# Patient Record
Sex: Female | Born: 1935 | Race: Black or African American | Hispanic: No | Smoking: Never smoker
Health system: Southern US, Community
[De-identification: ages and names within clinical notes are randomized; demographics above are authoritative.]

## PROBLEM LIST (undated history)

## (undated) DIAGNOSIS — K219 Gastro-esophageal reflux disease without esophagitis: Secondary | ICD-10-CM

## (undated) DIAGNOSIS — N289 Disorder of kidney and ureter, unspecified: Secondary | ICD-10-CM

## (undated) DIAGNOSIS — Z992 Dependence on renal dialysis: Secondary | ICD-10-CM

## (undated) DIAGNOSIS — I10 Essential (primary) hypertension: Secondary | ICD-10-CM

---

## 2013-07-04 ENCOUNTER — Emergency Department (HOSPITAL_COMMUNITY): Payer: Medicare Other

## 2013-07-04 ENCOUNTER — Encounter (HOSPITAL_COMMUNITY): Payer: Self-pay | Admitting: Emergency Medicine

## 2013-07-04 ENCOUNTER — Emergency Department (HOSPITAL_COMMUNITY)
Admission: EM | Admit: 2013-07-04 | Discharge: 2013-07-04 | Disposition: A | Discharge status: Home / Self Care | Payer: Medicare Other | Attending: Emergency Medicine | Admitting: Emergency Medicine

## 2013-07-04 DIAGNOSIS — K219 Gastro-esophageal reflux disease without esophagitis: Secondary | ICD-10-CM | POA: Insufficient documentation

## 2013-07-04 DIAGNOSIS — R7989 Other specified abnormal findings of blood chemistry: Secondary | ICD-10-CM

## 2013-07-04 DIAGNOSIS — Z79899 Other long term (current) drug therapy: Secondary | ICD-10-CM | POA: Insufficient documentation

## 2013-07-04 DIAGNOSIS — N19 Unspecified kidney failure: Secondary | ICD-10-CM

## 2013-07-04 DIAGNOSIS — Z992 Dependence on renal dialysis: Secondary | ICD-10-CM | POA: Insufficient documentation

## 2013-07-04 DIAGNOSIS — Z7982 Long term (current) use of aspirin: Secondary | ICD-10-CM | POA: Insufficient documentation

## 2013-07-04 DIAGNOSIS — R799 Abnormal finding of blood chemistry, unspecified: Secondary | ICD-10-CM | POA: Insufficient documentation

## 2013-07-04 DIAGNOSIS — I120 Hypertensive chronic kidney disease with stage 5 chronic kidney disease or end stage renal disease: Secondary | ICD-10-CM | POA: Insufficient documentation

## 2013-07-04 DIAGNOSIS — N186 End stage renal disease: Secondary | ICD-10-CM | POA: Insufficient documentation

## 2013-07-04 HISTORY — DX: Dependence on renal dialysis: Z99.2

## 2013-07-04 HISTORY — DX: Disorder of kidney and ureter, unspecified: N28.9

## 2013-07-04 HISTORY — DX: Gastro-esophageal reflux disease without esophagitis: K21.9

## 2013-07-04 HISTORY — DX: Essential (primary) hypertension: I10

## 2013-07-04 LAB — CBC WITH DIFFERENTIAL/PLATELET
BASOS PCT: 1 % (ref 0–1)
Basophils Absolute: 0 10*3/uL (ref 0.0–0.1)
EOS PCT: 10 % — AB (ref 0–5)
Eosinophils Absolute: 0.4 10*3/uL (ref 0.0–0.7)
HEMATOCRIT: 38.7 % (ref 36.0–46.0)
HEMOGLOBIN: 12.2 g/dL (ref 12.0–15.0)
Lymphocytes Relative: 16 % (ref 12–46)
Lymphs Abs: 0.6 10*3/uL — ABNORMAL LOW (ref 0.7–4.0)
MCH: 29.1 pg (ref 26.0–34.0)
MCHC: 31.5 g/dL (ref 30.0–36.0)
MCV: 92.4 fL (ref 78.0–100.0)
MONO ABS: 0.4 10*3/uL (ref 0.1–1.0)
MONOS PCT: 10 % (ref 3–12)
NEUTROS ABS: 2.4 10*3/uL (ref 1.7–7.7)
Neutrophils Relative %: 64 % (ref 43–77)
Platelets: 63 10*3/uL — ABNORMAL LOW (ref 150–400)
RBC: 4.19 MIL/uL (ref 3.87–5.11)
RDW: 17.9 % — ABNORMAL HIGH (ref 11.5–15.5)
WBC: 3.8 10*3/uL — ABNORMAL LOW (ref 4.0–10.5)

## 2013-07-04 LAB — COMPREHENSIVE METABOLIC PANEL
ALBUMIN: 3.4 g/dL — AB (ref 3.5–5.2)
ALT: 12 U/L (ref 0–35)
AST: 23 U/L (ref 0–37)
Alkaline Phosphatase: 87 U/L (ref 39–117)
BUN: 26 mg/dL — AB (ref 6–23)
CALCIUM: 9.6 mg/dL (ref 8.4–10.5)
CHLORIDE: 90 meq/L — AB (ref 96–112)
CO2: 29 meq/L (ref 19–32)
CREATININE: 4.38 mg/dL — AB (ref 0.50–1.10)
GFR calc Af Amer: 10 mL/min — ABNORMAL LOW (ref >90)
GFR, EST NON AFRICAN AMERICAN: 9 mL/min — AB (ref >90)
Glucose, Bld: 116 mg/dL — ABNORMAL HIGH (ref 70–99)
Potassium: 3.5 mEq/L — ABNORMAL LOW (ref 3.7–5.3)
Sodium: 136 mEq/L — ABNORMAL LOW (ref 137–147)
Total Bilirubin: 0.7 mg/dL (ref 0.3–1.2)
Total Protein: 8.3 g/dL (ref 6.0–8.3)

## 2013-07-04 LAB — D-DIMER, QUANTITATIVE (NOT AT ARMC): D-Dimer, Quant: 1.17 ug/mL-FEU — ABNORMAL HIGH (ref 0.00–0.48)

## 2013-07-04 LAB — PRO B NATRIURETIC PEPTIDE

## 2013-07-04 LAB — TROPONIN I

## 2013-07-04 MED ORDER — IOHEXOL 350 MG/ML SOLN
100.0000 mL | Freq: Once | INTRAVENOUS | Status: AC | PRN
  Administered 2013-07-04: 100 mL via INTRAVENOUS

## 2013-07-04 NOTE — ED Provider Notes (Addendum)
CSN: [id number]     Arrival date & time 07/04/13  1450 History   First MD Initiated Contact with Patient 07/04/13 1507     Chief Complaint  Patient presents with  . Shortness of Breath     (Consider location/radiation/quality/duration/timing/severity/associated sxs/prior Treatment) Patient is a 79 y.o. female presenting with shortness of breath. The history is provided by the patient (the pt complains of sob for one month).  Shortness of Breath Severity:  Mild Onset quality:  Gradual Timing:  Constant Progression:  Waxing and waning Chronicity:  New Context: not activity   Associated symptoms: no abdominal pain, no chest pain, no cough, no headaches and no rash     Past Medical History  Diagnosis Date  . Renal disorder   . Dialysis patient   . Hypertension   . GERD (gastroesophageal reflux disease)    No past surgical history on file. No family history on file. History  Substance Use Topics  . Smoking status: Never Smoker   . Smokeless tobacco: Not on file  . Alcohol Use: No   OB History   Grav Para Term Preterm Abortions TAB SAB Ect Mult Living                 Review of Systems  Constitutional: Negative for appetite change and fatigue.  HENT: Negative for congestion, ear discharge and sinus pressure.   Eyes: Negative for discharge.  Respiratory: Positive for shortness of breath. Negative for cough.   Cardiovascular: Negative for chest pain.  Gastrointestinal: Negative for abdominal pain and diarrhea.  Genitourinary: Negative for frequency and hematuria.  Musculoskeletal: Negative for back pain.  Skin: Negative for rash.  Neurological: Negative for seizures and headaches.  Psychiatric/Behavioral: Negative for hallucinations.      Allergies  Review of patient's allergies indicates no known allergies.  Home Medications   Prior to Admission medications   Medication Sig Start Date End Date Taking? Authorizing Provider  aspirin EC 81 MG tablet Take 81 mg by  mouth at bedtime.   Yes Historical Provider, MD  b complex-vitamin c-folic acid (NEPHRO-VITE) 0.8 MG TABS tablet Take 1 tablet by mouth daily.   Yes Historical Provider, MD  brimonidine (ALPHAGAN P) 0.1 % SOLN Place 1 drop into both eyes every 8 (eight) hours.   Yes Historical Provider, MD  cloNIDine (CATAPRES) 0.1 MG tablet Take 0.1 mg by mouth 2 (two) times daily.   Yes Historical Provider, MD  docusate sodium (COLACE) 100 MG capsule Take 100 mg by mouth daily as needed. constipation   Yes Historical Provider, MD  dorzolamide-timolol (COSOPT) 22.3-6.8 MG/ML ophthalmic solution Place 1 drop into both eyes 2 (two) times daily.   Yes Historical Provider, MD  lovastatin (MEVACOR) 20 MG tablet Take 20 mg by mouth at bedtime.   Yes Historical Provider, MD  ranitidine (ZANTAC) 150 MG tablet Take 150 mg by mouth 2 (two) times daily.   Yes Historical Provider, MD  Travoprost, BAK Free, (TRAVATAN) 0.004 % SOLN ophthalmic solution Place 1 drop into both eyes at bedtime.   Yes Historical Provider, MD   BP 137/77  Pulse 93  Temp(Src) 97.7 F (36.5 C) (Oral)  Resp 18  SpO2 97% Physical Exam  Constitutional: She is oriented to person, place, and time. She appears well-developed.  HENT:  Head: Normocephalic.  Eyes: Conjunctivae and EOM are normal. No scleral icterus.  Neck: Neck supple. No thyromegaly present.  Cardiovascular: Normal rate and regular rhythm.  Exam reveals no gallop and no friction rub.  No murmur heard. Pulmonary/Chest: No stridor. She has no wheezes. She has no rales. She exhibits no tenderness.  Abdominal: She exhibits no distension. There is no tenderness. There is no rebound.  Musculoskeletal: Normal range of motion. She exhibits no edema.  dialysis graft both arms.  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  Neurological: She is oriented to person, place, and time. She exhibits normal muscle tone. Coordination normal.  Skin: No  rash noted. No erythema.  Psychiatric: She has a normal mood and affect. Her behavior is normal.    ED Course  Procedures (including critical care time) Labs Review Labs Reviewed  CBC WITH DIFFERENTIAL - Abnormal; Notable for the following:    WBC 3.8 (*)    RDW 17.9 (*)    Platelets 63 (*)    Lymphs Abs 0.6 (*)    Eosinophils Relative 10 (*)    All other components within normal limits  COMPREHENSIVE METABOLIC PANEL - Abnormal; Notable for the following:    Sodium 136 (*)    Potassium 3.5 (*)    Chloride 90 (*)    Glucose, Bld 116 (*)    BUN 26 (*)    Creatinine, Ser 4.38 (*)    Albumin 3.4 (*)    GFR calc non Af Amer 9 (*)    GFR calc Af Amer 10 (*)    All other components within normal limits  D-DIMER, QUANTITATIVE - Abnormal; Notable for the following:    D-Dimer, Quant 1.17 (*)    All other components within normal limits  PRO B NATRIURETIC PEPTIDE - Abnormal; Notable for the following:    Pro B Natriuretic peptide (BNP) >70000.0 (*)    All other components within normal limits  TROPONIN I    Imaging Review Ct Angio Chest Pe W/cm &/or Wo Cm  07/04/2013   CLINICAL DATA:  Chest pain. Shortness of breath. Current 30 year history of hemodialysis.  EXAM: CT ANGIOGRAPHY CHEST WITH CONTRAST  TECHNIQUE: Multidetector CT imaging of the chest was performed using the standard protocol during bolus administration of intravenous contrast. Multiplanar CT image reconstructions and MIPs were obtained to evaluate the vascular anatomy.  CONTRAST:  100mL OMNIPAQUE IOHEXOL 350 MG/ML IV.  COMPARISON:  None.  FINDINGS: Contrast opacification of the pulmonary arteries is good. Respiratory motion blurred many of the images. Overall, the study is of good diagnostic quality.  No filling defects within either main pulmonary artery or their branches in either lung to suggest pulmonary embolism. 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 Extensive 3 vessel coronary  atherosclerosis. Mitral annular and aortic annular calcification. Mild aortic valvular calcification. No pericardial effusion. Moderate atherosclerosis involving the thoracic and upper abdominal aorta without evidence of aneurysm.  Numerous collateral veins in the right chest wall upon injection of a peripheral vein in the right upper extremity, consistent with central venous occlusion (as there is no opacification of the right axillary, are right subclavian, or innominate vein). Edema in the subcutaneous tissues of the right chest wall. Dense opacification of the inferior vena cava is likely due to reconstitution of the IVC via the transthoracic collaterals. However, opacification of the hepatic veins is likely due to right heart disease.  Dialysis catheter tip in the lower right atrium. Numerous enlarged mediastinal and bilateral axillary lymph nodes. Index right paratracheal node (station 4R) measures approximately 2.4 x 1.6 cm (series 4, image 30). Index right axillary node measures approximately  2.0 x 2.3 cm (series 4, image 18). Index left axillary node measures approximately 1.3 x 2.2 cm (series 4, image 21).  Small bilateral pleural effusions and associated mild passive atelectasis in the lower lobes. No evidence of interstitial edema. Bronchiectasis involving both lower lobes, right greater than left. Vague nodular opacity in the inferior right upper lobe measuring approximately 5 x 7 x 5 mm. No other pulmonary parenchymal nodules. No confluent airspace consolidation.  Images of the upper abdomen demonstrate atrophic kidneys with multiple cysts, consistent with the cystic disease of hemodialysis. Retrocrural lymphadenopathy is suspected on the right, as there is soft tissue adjacent to the aorta, though it is difficult to measure a discrete node.  Review of the MIP images confirms the above findings.  IMPRESSION: 1. No evidence of pulmonary embolism. 2. Lymphadenopathy involving the mediastinum, the right  and left axilla, and likely the right retrocrural space in the upper abdomen. 3. Small bilateral pleural effusions and associated passive atelectasis in the lower lobes. No acute cardiopulmonary disease otherwise. 4. Vague 7 mm nodule in the inferior right upper lobe. Please see below for followup recommendations. 5. Bronchiectasis involving both lower lobes, right greater than left. 6. Cardiomegaly. 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 7. 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 If the patient is at high risk for bronchogenic carcinoma, follow-up chest CT at 3-6 months is recommended. If the patient is at low risk for bronchogenic carcinoma, follow-up chest CT at 6-12 months is recommended. This recommendation follows the consensus statement: Guidelines for Management of Small Pulmonary Nodules Detected on CT Scans: A Statement from the [employer] as published in Radiology 2005; 237:395-400.   Electronically Signed   By: Hulan Saas M.D.   On: 07/04/2013 17:44   Dg Chest Portable 1 View  07/04/2013   CLINICAL DATA:  Shortness of breath.  EXAM: PORTABLE CHEST - 1 VIEW  COMPARISON:  None.  FINDINGS: Cardiac silhouette moderately enlarged. Thoracic aorta atherosclerosis. Hilar and mediastinal contours otherwise unremarkable. Pulmonary venous hypertension without overt edema. Suboptimal inspiration with atelectasis in the lung bases. Lungs otherwise clear. Left jugular hemodialysis catheter tip in the right atrium.  IMPRESSION: 1. Suboptimal inspiration accounts for bibasilar atelectasis. No acute cardiopulmonary disease otherwise. 2. Moderate cardiomegaly. Pulmonary venous hypertension without overt edema currently.   Electronically Signed   By: Hulan Saas M.D.   On: 07/04/2013 15:40     EKG Interpretation   Date/Time:  Saturday Jul 04 2013 15:07:52  EDT Ventricular Rate:  91 PR Interval:  184 QRS Duration: 122 QT Interval:  407 QTC Calculation: 501 R Axis:   -56 Text Interpretation:  Sinus or ectopic atrial rhythm Nonspecific IVCD with  LAD Anterior infarct, old Abnormal T, consider ischemia, lateral leads  Confirmed by Kiari Hosmer  MD, Aram Domzalski [phone]) on 07/04/2013 6:42:09 PM      MDM   Final diagnoses:  Renal failure  Elevated brain natriuretic peptide (BNP) level    Elevated bnp.  Ct chest nad.  Except adenopathy.  sats 95% room air.  Pt to follow up with pcp.     Benny Lennert, MD 07/04/13 6045  Benny Lennert, MD 07/04/13 [phone]

## 2013-07-04 NOTE — ED Notes (Signed)
Pt states SOB began 2 weeks ago but got worse this morning. Pt also states a little cough. Pt denies pain.

## 2013-07-04 NOTE — ED Notes (Signed)
Ice given per request.

## 2013-07-04 NOTE — Discharge Instructions (Signed)
Follow up with your md next week. °

## 2013-09-26 DEATH — deceased

## 2015-09-29 IMAGING — CT CT ANGIO CHEST
2 of 4 series · 8 of 36 positions shown · IV contrast (Omnipaque 300)
Comparison: None.

CLINICAL DATA: Chest pain. Shortness of breath. Current 30 year
history of hemodialysis.

EXAM:
CT ANGIOGRAPHY CHEST WITH CONTRAST
TECHNIQUE: Multidetector CT imaging of the chest was performed using the
standard protocol during bolus administration of intravenous
contrast. Multiplanar CT image reconstructions and MIPs were
obtained to evaluate the vascular anatomy.
CONTRAST:  100mL OMNIPAQUE IOHEXOL 350 MG/ML IV.

[Series 4: pe 3.0 b40f · axial · 0.59mm/px · z∈[-191,-26]mm · 5 of 83 slices shown]
[im 14/83  lung]
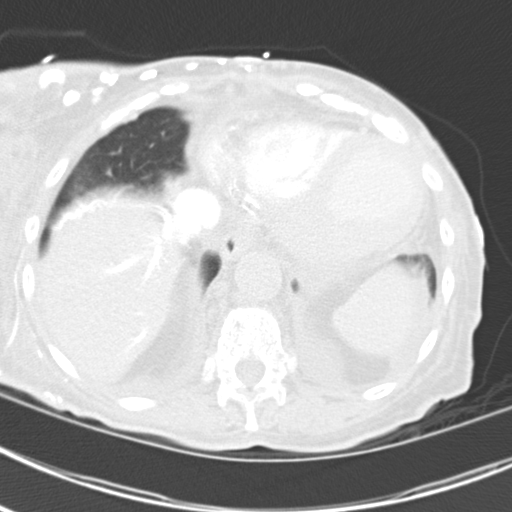
[im 28/83  mediastinal]
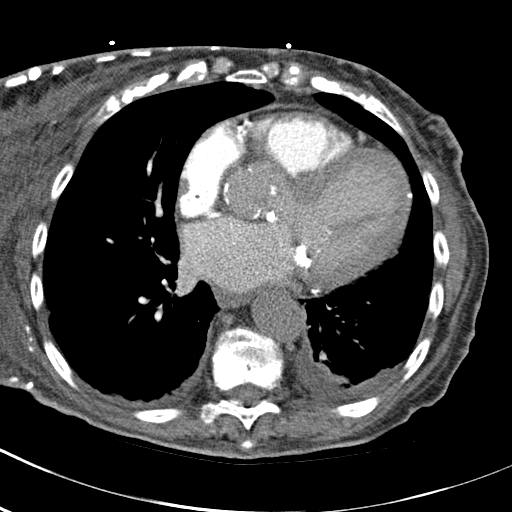
[im 42/83  lung]
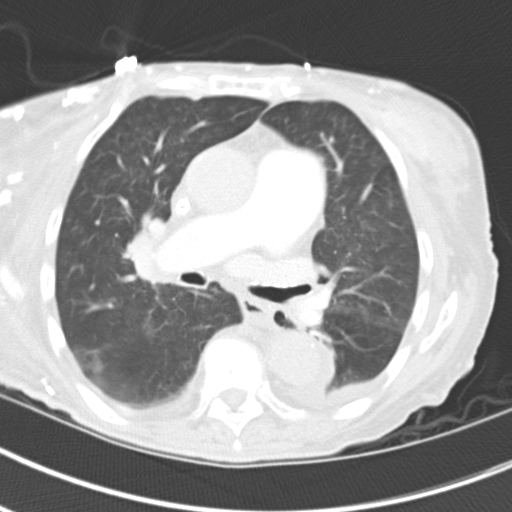
[im 55/83  mediastinal]
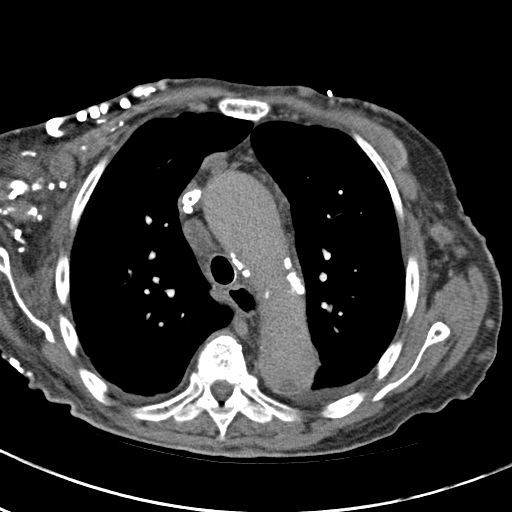
[im 69/83  lung]
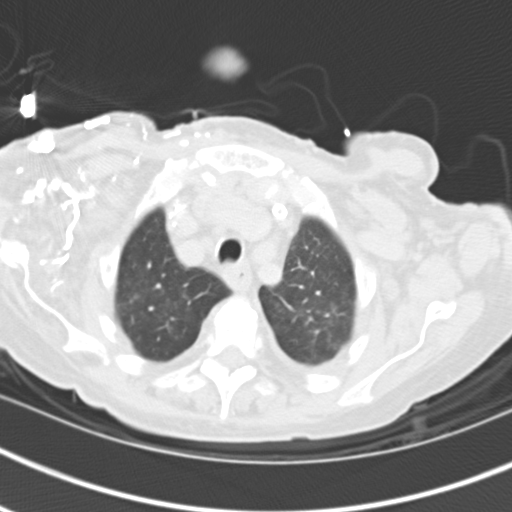

[Series 6: mpr coronal pe 3mm · coronal · 0.54mm/px · 3 of 83 slices shown]
[im 17/83  mediastinal]
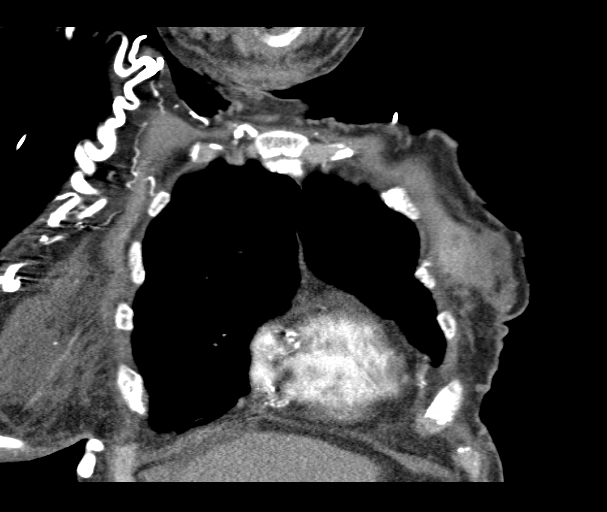
[im 33/83  mediastinal]
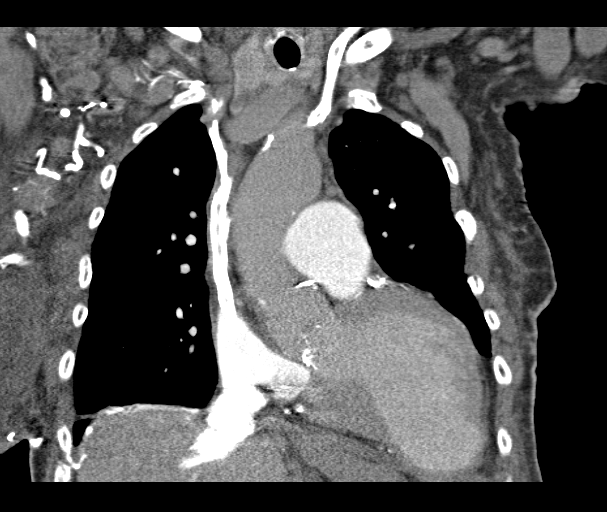
[im 50/83  mediastinal]
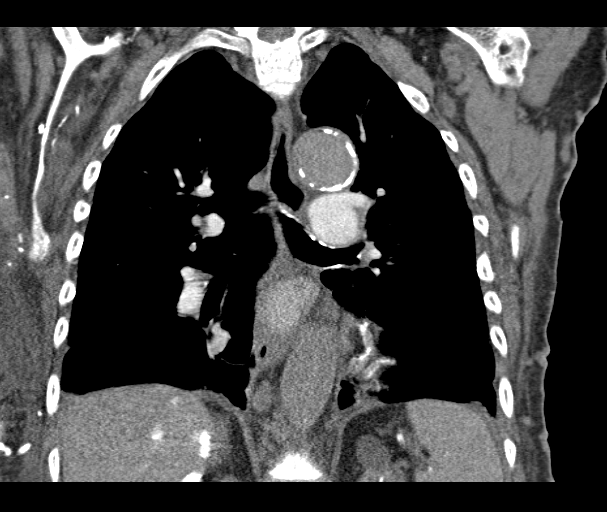

[8 of 36 positions shown; findings below may reference images not displayed]

FINDINGS: Contrast opacification of the pulmonary arteries is good.
Respiratory motion blurred many of the images. Overall, the study is
of good diagnostic quality.

No filling defects within either main pulmonary artery or their
branches in either lung to suggest pulmonary embolism. Heart
enlarged, with evidence of a left ventricular apex aneurysm.
Extensive 3 vessel coronary atherosclerosis. Mitral annular and
aortic annular calcification. Mild aortic valvular calcification. No
pericardial effusion. Moderate atherosclerosis involving the
thoracic and upper abdominal aorta without evidence of aneurysm.

Numerous collateral veins in the right chest wall upon injection of
a peripheral vein in the right upper extremity, consistent with
central venous occlusion (as there is no opacification of the right
axillary, are right subclavian, or innominate vein). Edema in the
subcutaneous tissues of the right chest wall. Dense opacification of
the inferior vena cava is likely due to reconstitution of the IVC
via the transthoracic collaterals. However, opacification of the
hepatic veins is likely due to right heart disease.

Dialysis catheter tip in the lower right atrium. Numerous enlarged
mediastinal and bilateral axillary lymph nodes. Index right
paratracheal node (station 4R) measures approximately 2.4 x 1.6 cm
(series 4, image 30). Index right axillary node measures
approximately 2.0 x 2.3 cm (series 4, image 18). Index left axillary
node measures approximately 1.3 x 2.2 cm (series 4, image 21).

Small bilateral pleural effusions and associated mild passive
atelectasis in the lower lobes. No evidence of interstitial edema.
Bronchiectasis involving both lower lobes, right greater than left.
Vague nodular opacity in the inferior right upper lobe measuring
approximately 5 x 7 x 5 mm. No other pulmonary parenchymal nodules.
No confluent airspace consolidation.

Images of the upper abdomen demonstrate atrophic kidneys with
multiple cysts, consistent with the cystic disease of hemodialysis.
Retrocrural lymphadenopathy is suspected on the right, as there is
soft tissue adjacent to the aorta, though it is difficult to measure
a discrete node.

Review of the MIP images confirms the above findings.
IMPRESSION: 1. No evidence of pulmonary embolism.
2. Lymphadenopathy involving the mediastinum, the right and left
axilla, and likely the right retrocrural space in the upper abdomen.
3. Small bilateral pleural effusions and associated passive
atelectasis in the lower lobes. No acute cardiopulmonary disease
otherwise.
4. Vague 7 mm nodule in the inferior right upper lobe. Please see
below for followup recommendations.
5. Bronchiectasis involving both lower lobes, right greater than
left.
6. Cardiomegaly. Right ventricular apex aneurysm consistent with
prior MI.
7. Central venous occlusion on the right, as there are numerous
transthoracic collateral veins filling upon injection of a right
upper extremity peripheral vein.
If the patient is at high risk for bronchogenic carcinoma, follow-up
chest CT at 3-6 months is recommended. If the patient is at low risk
for bronchogenic carcinoma, follow-up chest CT at 6-12 months is
recommended. This recommendation follows the consensus statement:
Guidelines for Management of Small Pulmonary Nodules Detected on CT
Scans: A Statement from the [HOSPITAL] as published in
